# Patient Record
Sex: Female | Born: 1940 | Race: White | Hispanic: No | Marital: Married
Health system: Southern US, Community
[De-identification: ages and names within clinical notes are randomized; demographics above are authoritative.]

---

## 2006-01-07 ENCOUNTER — Other Ambulatory Visit: Payer: Self-pay

## 2006-01-07 ENCOUNTER — Emergency Department: Payer: Self-pay | Admitting: Emergency Medicine

## 2009-07-26 ENCOUNTER — Ambulatory Visit: Payer: Self-pay | Admitting: Rheumatology

## 2009-08-14 ENCOUNTER — Ambulatory Visit: Payer: Self-pay | Admitting: Unknown Physician Specialty

## 2009-08-16 ENCOUNTER — Ambulatory Visit: Payer: Self-pay | Admitting: Unknown Physician Specialty

## 2009-11-06 ENCOUNTER — Ambulatory Visit: Payer: Self-pay | Admitting: Rheumatology

## 2011-01-01 ENCOUNTER — Ambulatory Visit: Payer: Self-pay | Admitting: Family Medicine

## 2011-01-29 ENCOUNTER — Ambulatory Visit: Payer: Self-pay | Admitting: Internal Medicine

## 2011-02-06 ENCOUNTER — Ambulatory Visit: Payer: Self-pay | Admitting: Internal Medicine

## 2011-03-31 ENCOUNTER — Ambulatory Visit: Payer: Self-pay | Admitting: Gastroenterology

## 2011-04-02 LAB — PATHOLOGY REPORT

## 2012-02-09 ENCOUNTER — Ambulatory Visit: Payer: Self-pay | Admitting: Family Medicine

## 2012-02-16 ENCOUNTER — Emergency Department: Payer: Self-pay | Admitting: *Deleted

## 2012-02-16 LAB — BASIC METABOLIC PANEL
Calcium, Total: 8.9 mg/dL (ref 8.5–10.1)
EGFR (African American): >60
EGFR (Non-African Amer.): 58 — ABNORMAL LOW
Glucose: 141 mg/dL — ABNORMAL HIGH (ref 65–99)
Potassium: 4.2 mmol/L (ref 3.5–5.1)
Sodium: 140 mmol/L (ref 136–145)

## 2012-02-16 LAB — URINALYSIS, COMPLETE
Bilirubin,UR: NEGATIVE
Glucose,UR: NEGATIVE mg/dL (ref 0–75)
Ph: 8 (ref 4.5–8.0)
Specific Gravity: 1.013 (ref 1.003–1.030)
Squamous Epithelial: 3

## 2012-02-16 LAB — CK TOTAL AND CKMB (NOT AT ARMC): CK, Total: 101 U/L (ref 21–215)

## 2012-02-16 LAB — CBC
MCH: 32.1 pg (ref 26.0–34.0)
Platelet: 204 10*3/uL (ref 150–440)
RDW: 12.4 % (ref 11.5–14.5)
WBC: 5.4 10*3/uL (ref 3.6–11.0)

## 2012-03-29 ENCOUNTER — Encounter: Payer: Self-pay | Admitting: Internal Medicine

## 2012-05-23 ENCOUNTER — Emergency Department: Payer: Self-pay | Admitting: Unknown Physician Specialty

## 2012-05-23 LAB — COMPREHENSIVE METABOLIC PANEL
Albumin: 3.7 g/dL (ref 3.4–5.0)
Anion Gap: 9 (ref 7–16)
Bilirubin,Total: 0.9 mg/dL (ref 0.2–1.0)
Calcium, Total: 9.4 mg/dL (ref 8.5–10.1)
Chloride: 103 mmol/L (ref 98–107)
Co2: 26 mmol/L (ref 21–32)
EGFR (African American): >60
Glucose: 155 mg/dL — ABNORMAL HIGH (ref 65–99)
Osmolality: 283 (ref 275–301)
Potassium: 4.5 mmol/L (ref 3.5–5.1)
SGOT(AST): 41 U/L — ABNORMAL HIGH (ref 15–37)

## 2012-05-23 LAB — URINALYSIS, COMPLETE
Bilirubin,UR: NEGATIVE
Ph: 6 (ref 4.5–8.0)
Protein: 30
RBC,UR: 107 /HPF (ref 0–5)
Squamous Epithelial: 3
WBC UR: 2 /HPF (ref 0–5)

## 2012-05-23 LAB — CBC
HCT: 42.3 % (ref 35.0–47.0)
HGB: 14.6 g/dL (ref 12.0–16.0)
MCH: 32 pg (ref 26.0–34.0)
MCV: 93 fL (ref 80–100)
RBC: 4.57 10*6/uL (ref 3.80–5.20)
RDW: 12.8 % (ref 11.5–14.5)

## 2012-05-27 ENCOUNTER — Emergency Department: Payer: Self-pay | Admitting: Unknown Physician Specialty

## 2012-05-27 LAB — BASIC METABOLIC PANEL
BUN: 26 mg/dL — ABNORMAL HIGH (ref 7–18)
Calcium, Total: 9.8 mg/dL (ref 8.5–10.1)
Chloride: 106 mmol/L (ref 98–107)
Creatinine: 1.41 mg/dL — ABNORMAL HIGH (ref 0.60–1.30)
EGFR (African American): 44 — ABNORMAL LOW
EGFR (Non-African Amer.): 38 — ABNORMAL LOW
Glucose: 117 mg/dL — ABNORMAL HIGH (ref 65–99)
Potassium: 4.3 mmol/L (ref 3.5–5.1)
Sodium: 138 mmol/L (ref 136–145)

## 2012-05-27 LAB — CBC
HCT: 47.8 % — ABNORMAL HIGH (ref 35.0–47.0)
MCHC: 33.7 g/dL (ref 32.0–36.0)
MCV: 94 fL (ref 80–100)
Platelet: 272 10*3/uL (ref 150–440)
RBC: 5.1 10*6/uL (ref 3.80–5.20)
RDW: 13.3 % (ref 11.5–14.5)
WBC: 10.3 10*3/uL (ref 3.6–11.0)

## 2012-05-28 LAB — URINE CULTURE

## 2012-06-04 ENCOUNTER — Ambulatory Visit: Payer: Self-pay | Admitting: Urology

## 2012-11-18 ENCOUNTER — Ambulatory Visit: Payer: Self-pay | Admitting: Urology

## 2013-02-09 ENCOUNTER — Ambulatory Visit: Payer: Self-pay | Admitting: Family Medicine

## 2013-11-23 ENCOUNTER — Ambulatory Visit: Payer: Self-pay | Admitting: Family Medicine

## 2014-02-20 ENCOUNTER — Ambulatory Visit: Payer: Self-pay | Admitting: Family Medicine

## 2014-06-28 IMAGING — CT CT ABD-PELV W/O CM
1 of 2 series · 15 of 32 positions shown, 19 images · non-contrast
Comparison: none

REASON FOR EXAM: (1) left flank pain; (2) left flank pain
COMMENTS:

[Series 2: 3mm soft tissue · axial · 0.81mm/px · z∈[-1147,-673]mm · 15 of 174 slices shown, 19 images]
[im 8/174  soft-tissue]
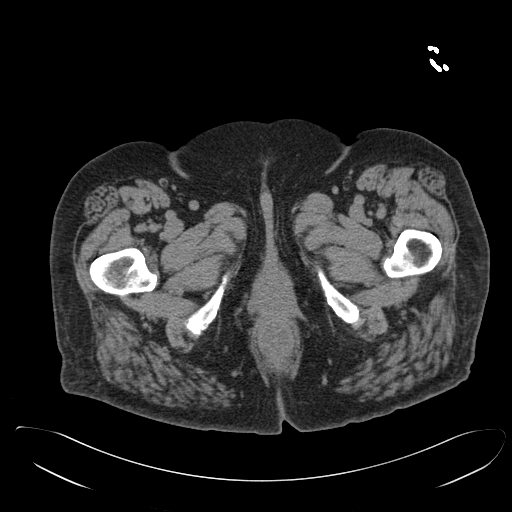
[im 8/174  bone]
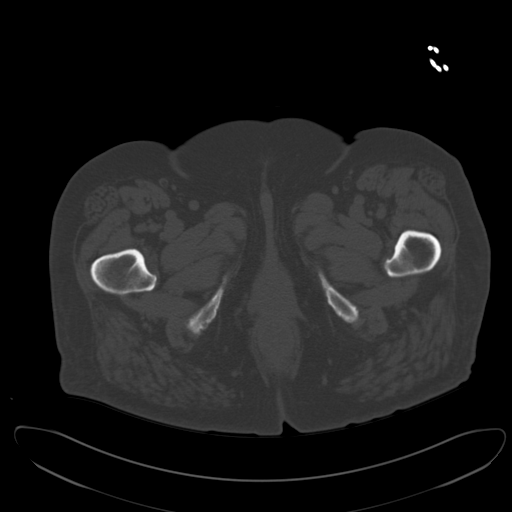
[im 23/174  soft-tissue]
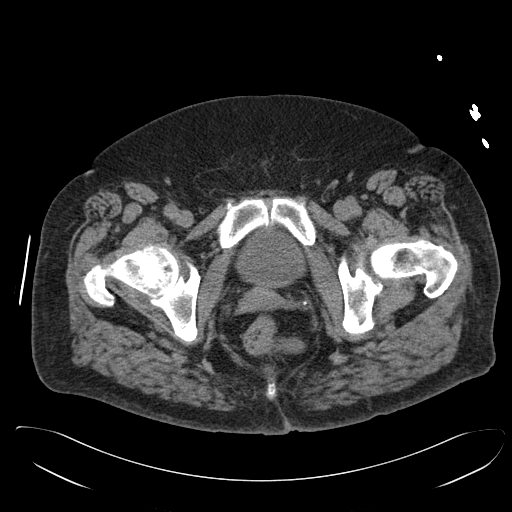
[im 38/174  soft-tissue]
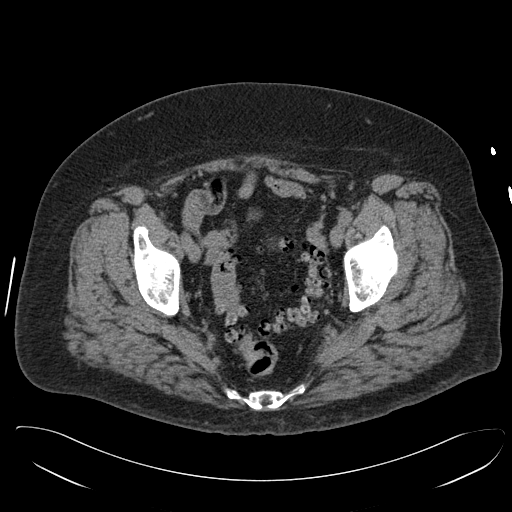
[im 46/174  soft-tissue]
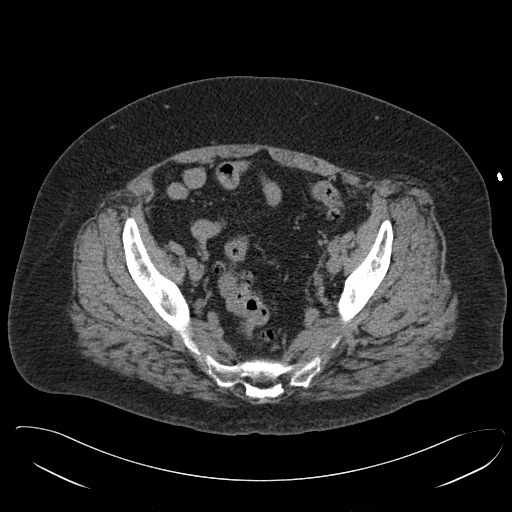
[im 61/174  soft-tissue]
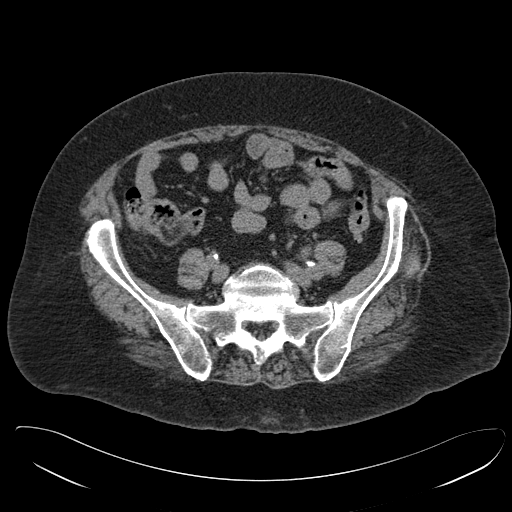
[im 76/174  soft-tissue]
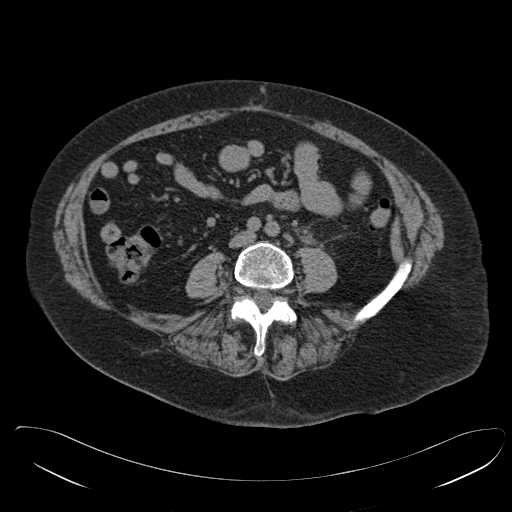
[im 91/174  soft-tissue]
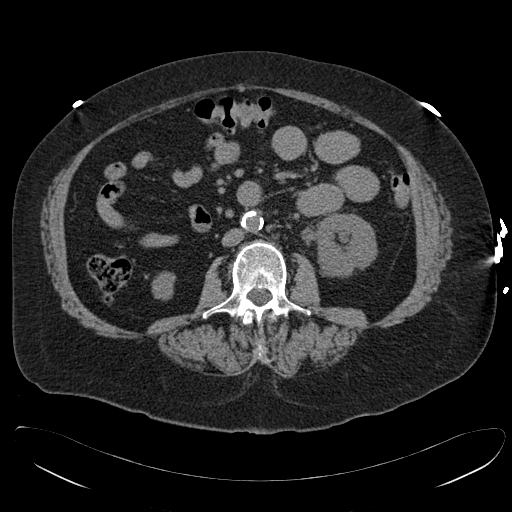
[im 98/174  soft-tissue]
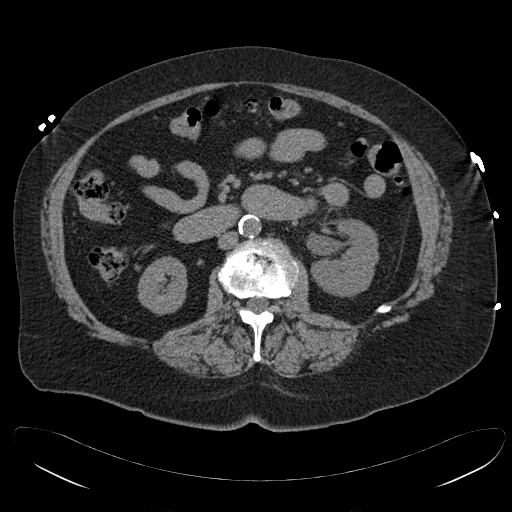
[im 113/174  soft-tissue]
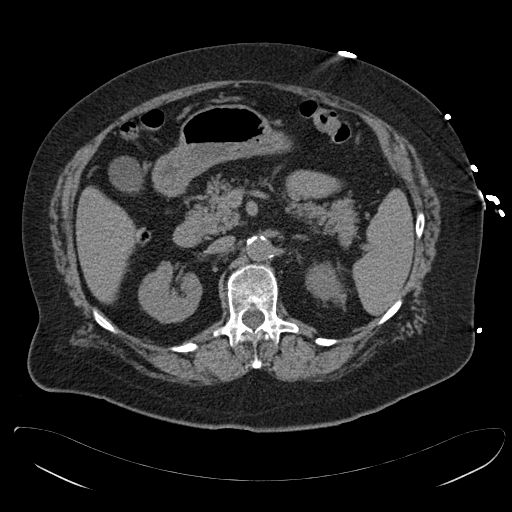
[im 113/174  bone]
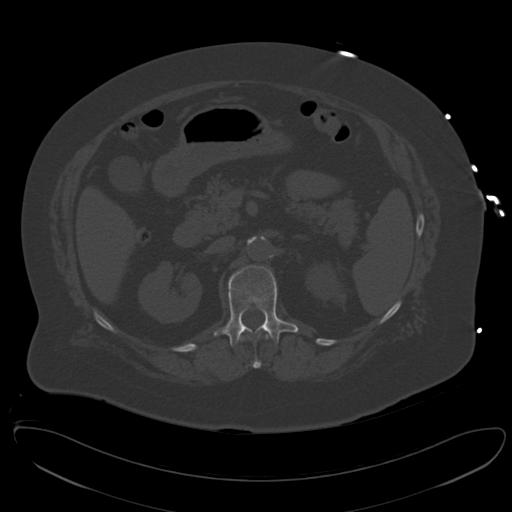
[im 128/174  soft-tissue]
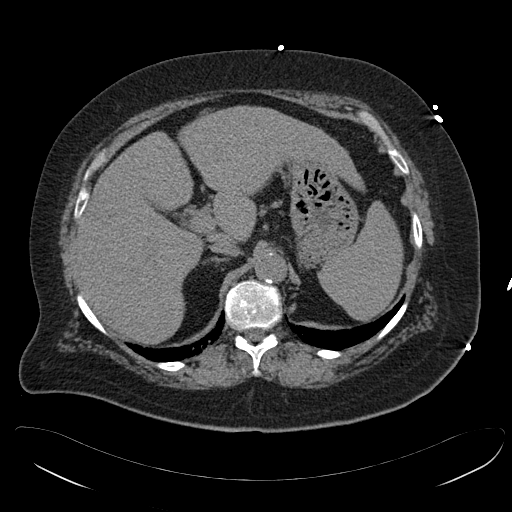
[im 136/174  soft-tissue]
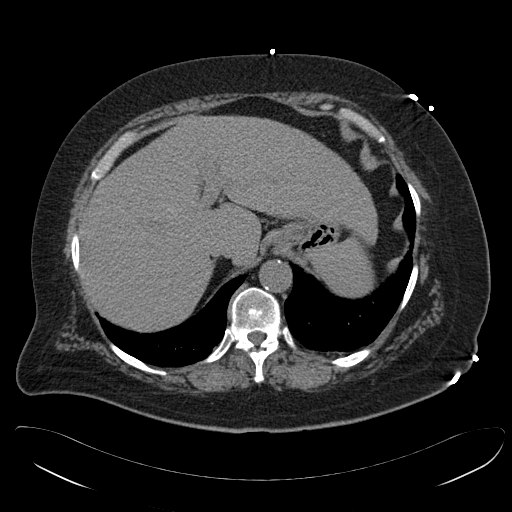
[im 143/174  lung]
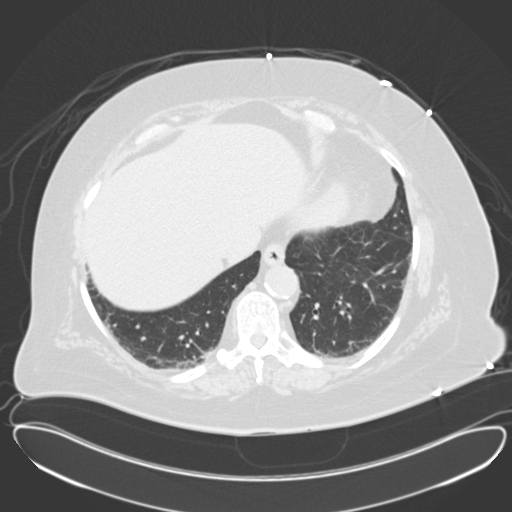
[im 151/174  soft-tissue]
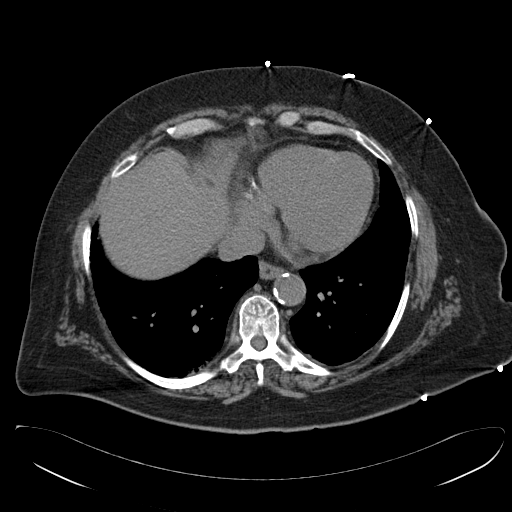
[im 151/174  lung]
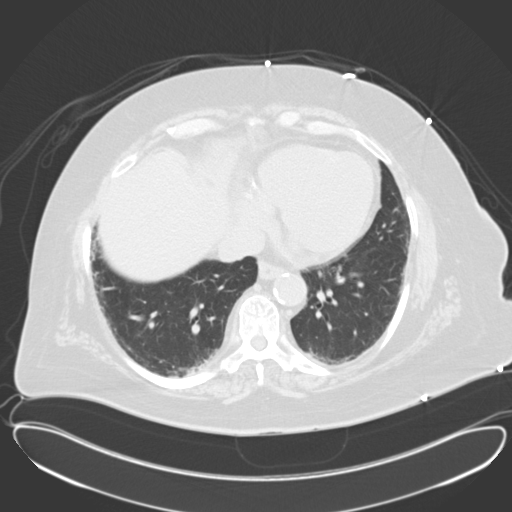
[im 158/174  lung]
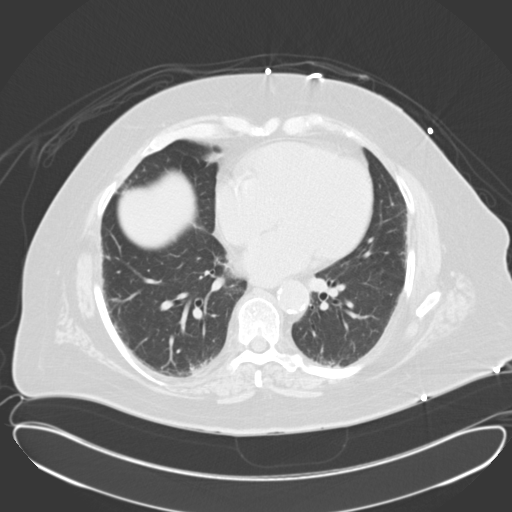
[im 166/174  soft-tissue]
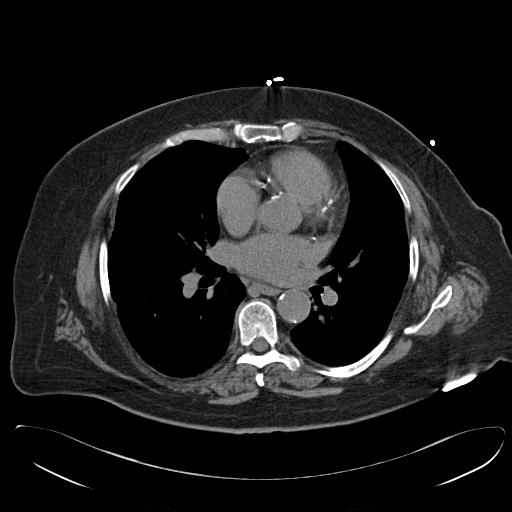
[im 166/174  lung]
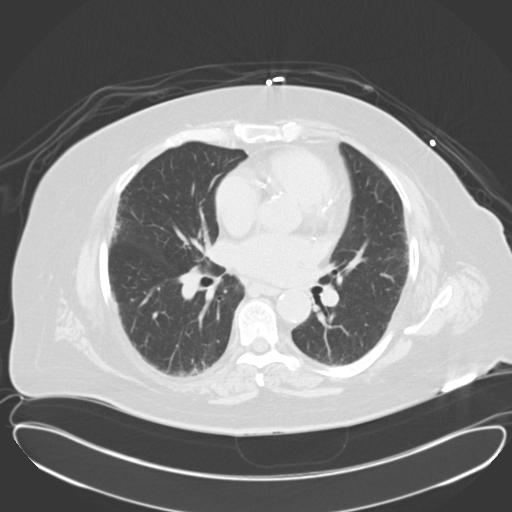

[15 of 32 positions shown; findings below may reference images not displayed]

PROCEDURE:     CT  - CT ABDOMEN AND PELVIS W[DATE]  [DATE]

RESULT:     Axial noncontrast CT scanning was performed through the abdomen
and pelvis with reconstructions at 3 mm intervals and slice thicknesses.
Review of multiplanar reconstructed images was performed separately on the
VIA monitor.

There is mild hydronephrosis and proximal and mid hydroureter on the left
secondary to a densely calcified stone measuring 6 mm in greatest dimension
demonstrated best on image 131 in the distal third of the ureter. No other
stones are evident on the left. No stones are seen on the right nor is there
evidence of obstruction of the right kidney.

There is sigmoid diverticulosis without evidence of acute diverticulitis.
Scattered diverticula are noted elsewhere in the colon. There is no small or
large bowel obstruction. The liver, gallbladder, spleen, partially distended
stomach, pancreas, adrenal glands, and periaortic and pericaval regions are
normal in appearance. There is a shallow left inguinal hernia containing
only fat. The lumbar vertebral bodies are preserved in height. There is disc
space narrowing at L2-L3 T12-L1 with vacuum phenomena. The lung bases
exhibit minimal fibrotic and atelectatic changes posteriorly.
IMPRESSION: 1. There is mild hydronephrosis and hydroureter on the left secondary to a 6
mm diameter stone in the distal third of the left ureter.
2. There is diverticulosis without evidence of acute diverticulitis.
3. There is mild distention of the gallbladder without evidence of stones or
wall thickening. This may be related to a relative fasting state.

[REDACTED]

## 2014-07-02 IMAGING — US US RENAL KIDNEY
1 series · 14 of 25 positions shown · non-contrast
Comparison: none

REASON FOR EXAM: left flank pain 6mm Umusalama Karagire ureter by ct on [REDACTED]
COMMENTS:

[Series 1: us renal kidney · 0.30mm/px · 14 of 29 slices shown]
[im 1/29]
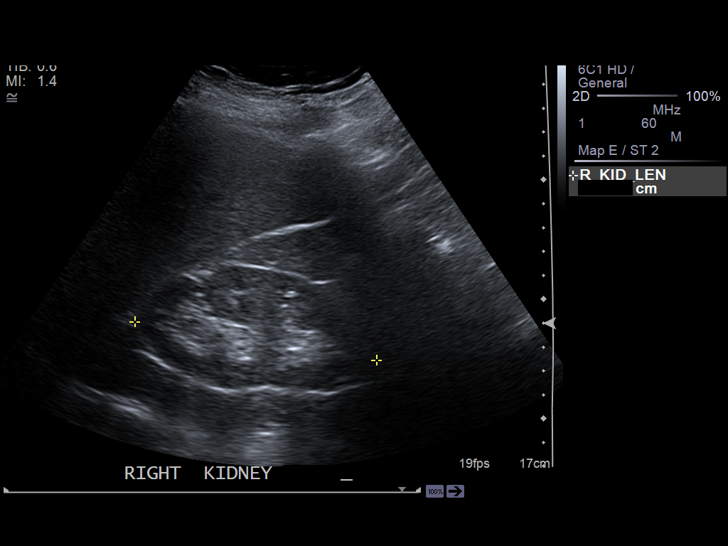
[im 3/29]
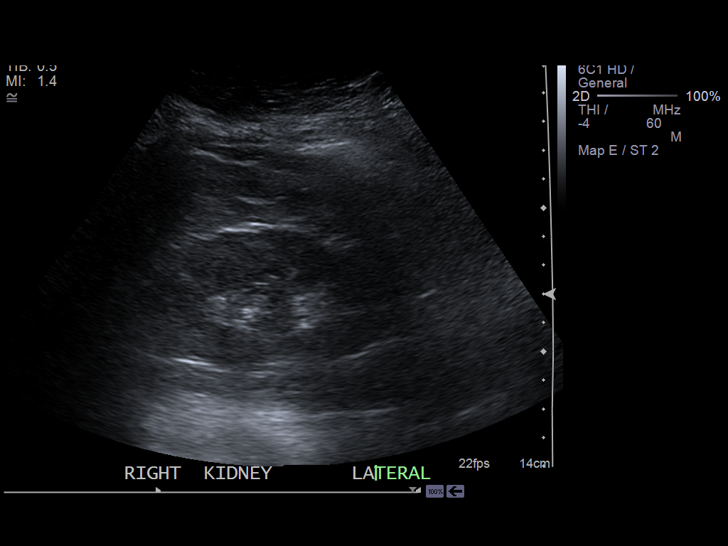
[im 5/29]
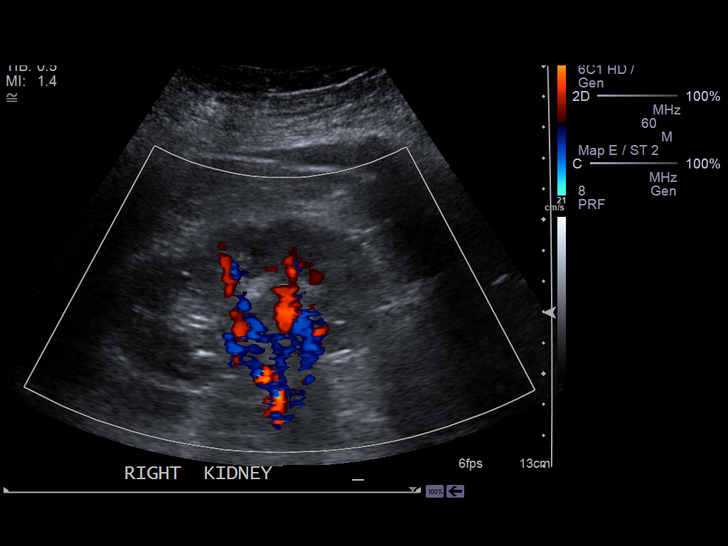
[im 8/29]
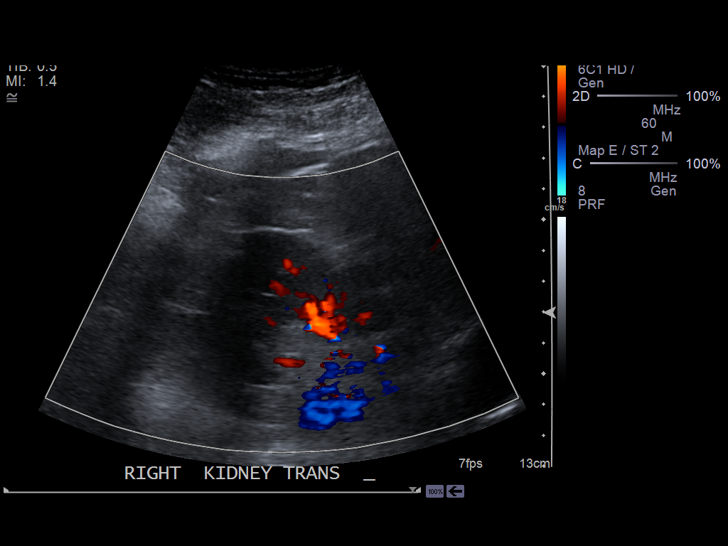
[im 10/29]
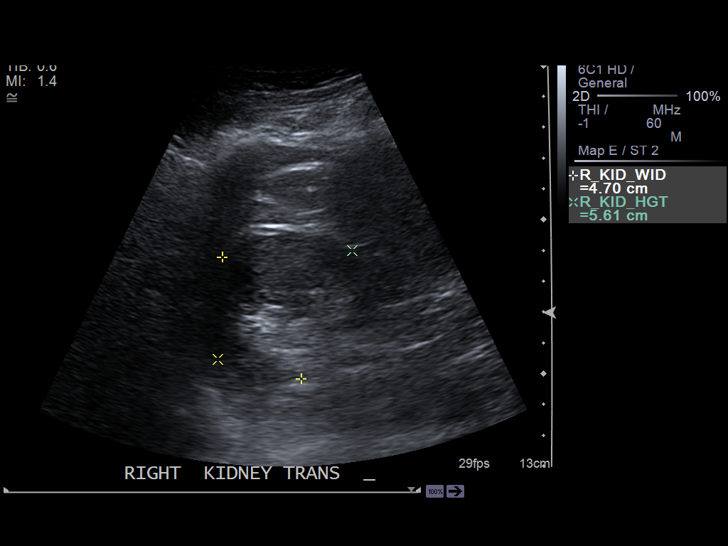
[im 11/29]
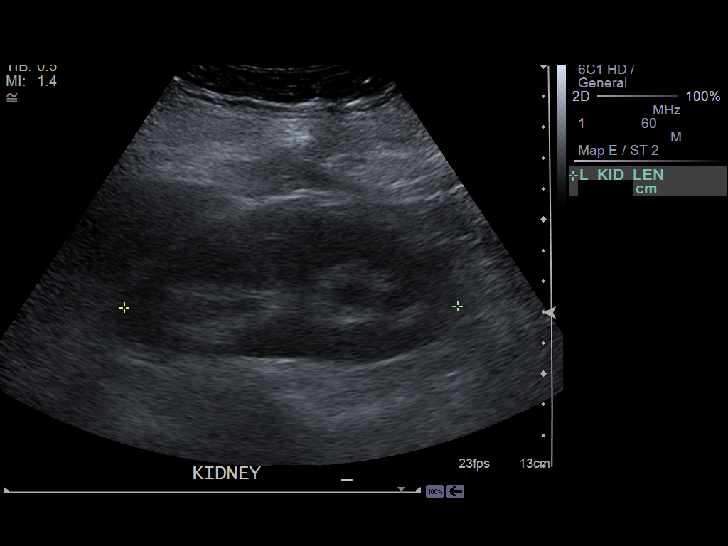
[im 13/29]
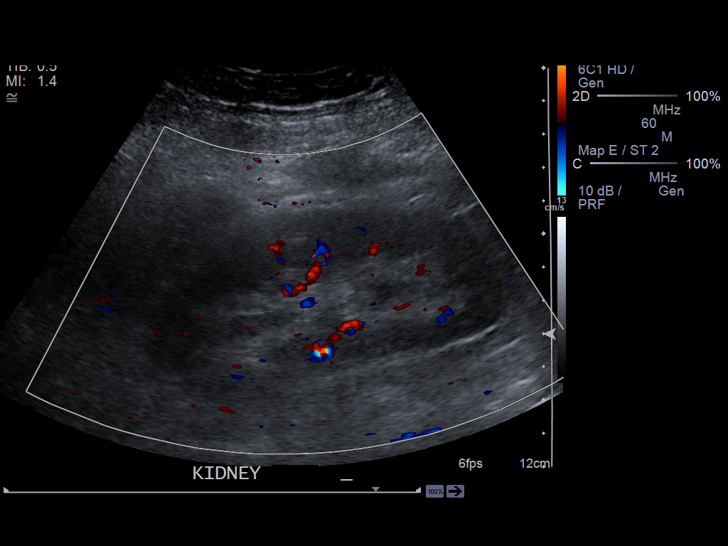
[im 16/29]
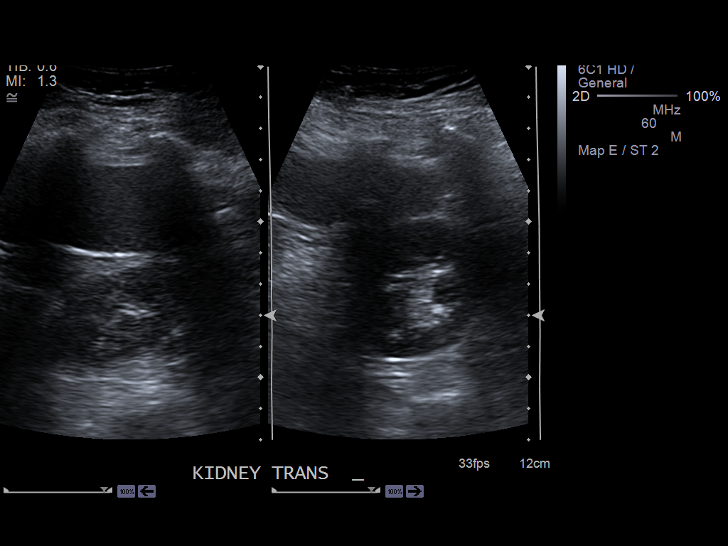
[im 18/29]
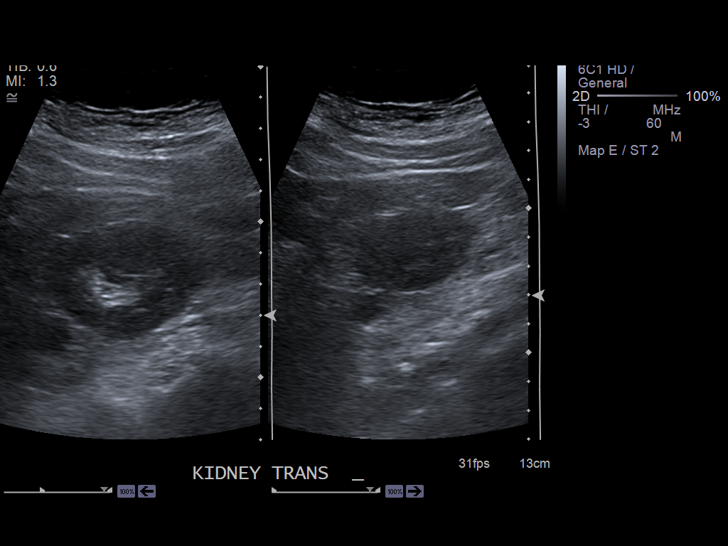
[im 19/29]
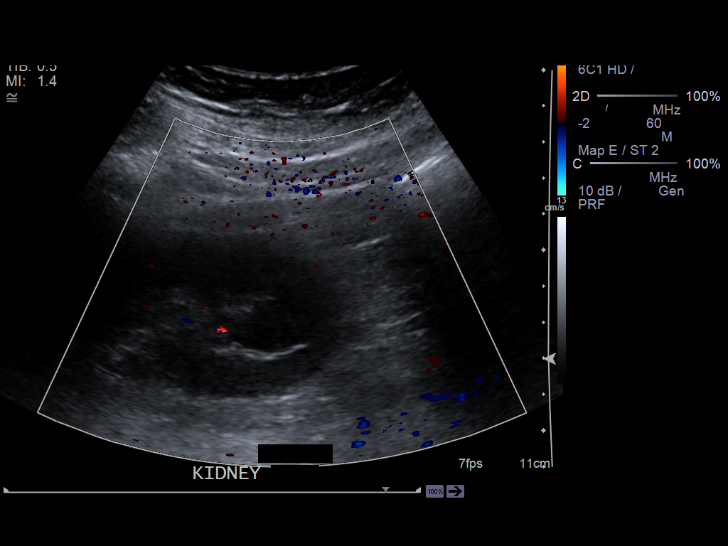
[im 22/29]
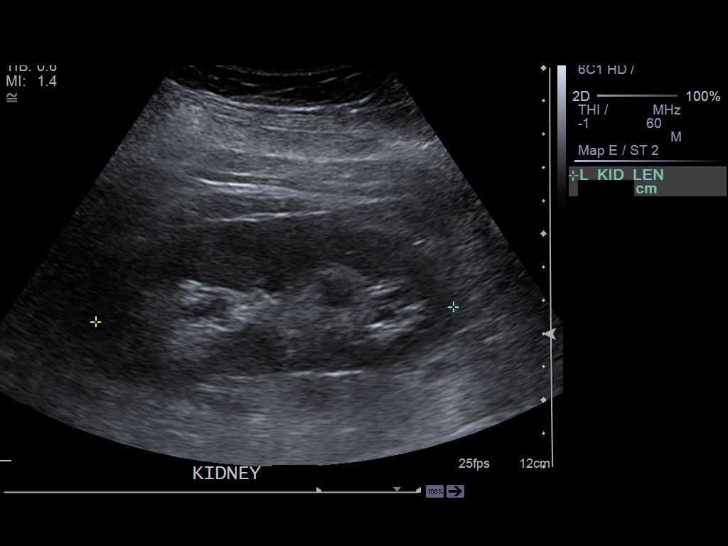
[im 24/29]
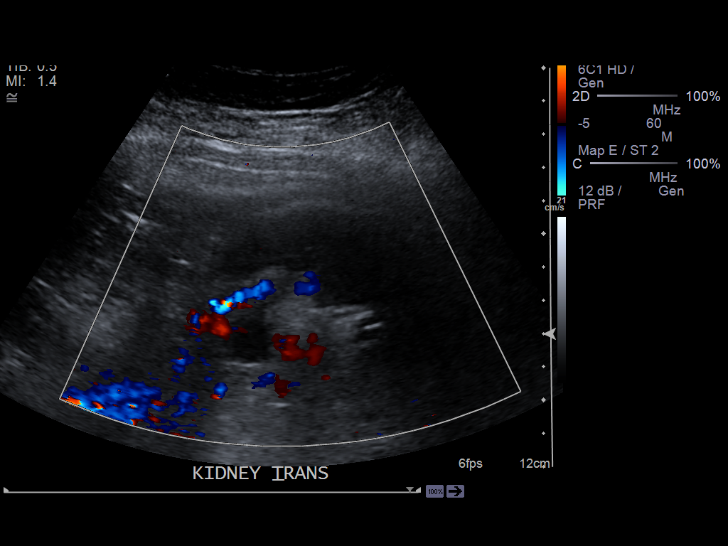
[im 26/29]
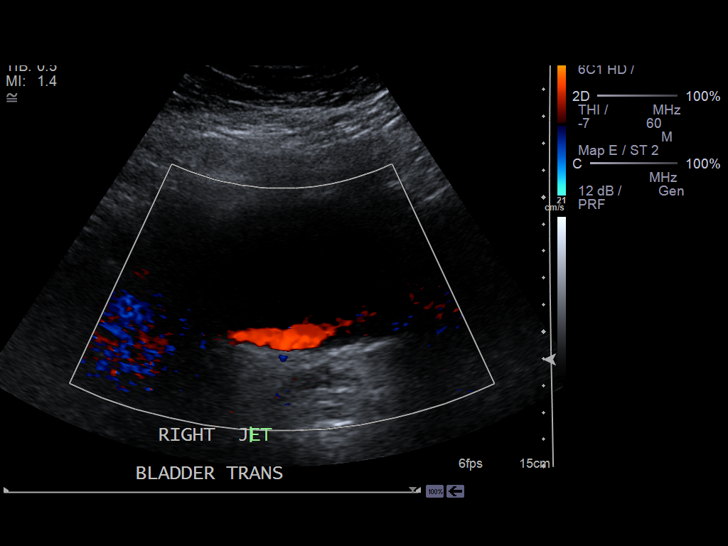
[im 29/29]
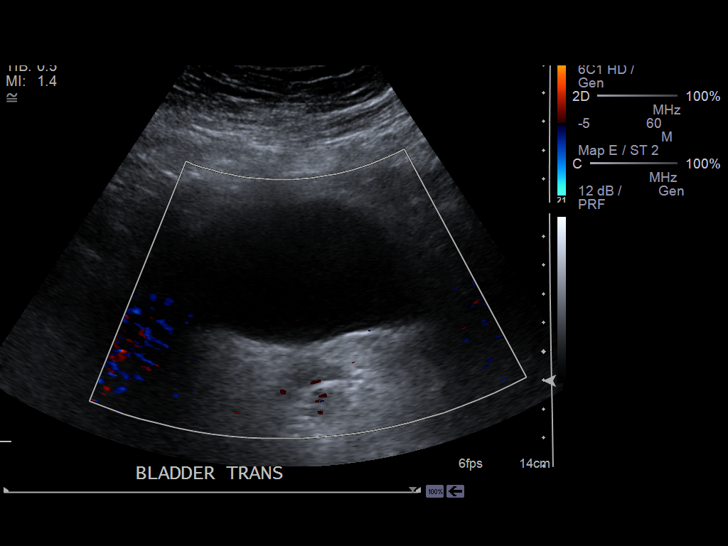

[14 of 25 positions shown; findings below may reference images not displayed]

PROCEDURE:     US  - US KIDNEY  - May 27, 2012  [DATE]

RESULT:     The right kidney measures 10.2 x 4.7 x 5.6 cm. There is no
hydronephrosis and a ureteral jet is demonstrated. On the left no stone is
demonstrated but there is mild hydronephrosis and hydroureter. The left
kidney measures 10.8 by 4.4 x 4.9 cm. Neither kidney exhibits perinephric
fluid collections.
IMPRESSION: There is mild hydronephrosis and hydroureter on the left.
No stone is identified. On the right no hydronephrosis is seen nor are
stones identified.

[REDACTED]

## 2015-01-02 NOTE — Op Note (Signed)
PATIENT NAME:  Diana Cabrera, Diana Cabrera MR#:  [id number] DATE OF BIRTH:  1941-07-09  DATE OF PROCEDURE:  06/04/2012  PREOPERATIVE DIAGNOSES: Left ureterolithiasis, hydronephrosis.   POSTOPERATIVE DIAGNOSES: Left ureterolithiasis, hydronephrosis.   PROCEDURES: Left ureteroscopy with holmium laser lithotripsy, left double-J ureteral stent placement.   SURGEON: Assunta GamblesBrian Halen Antenucci, M.D.   ANESTHESIA: Laryngeal mask airway anesthesia.   INDICATION: The patient is a 74 year old white female with a long history of nephrolithiasis. She presented recently to the emergency room with left-sided flank pain. She was noted to have a 6 mm distal left ureteral calculus with moderate hydronephrosis. She has had continued pain and discomfort with failure of stone progression. She presents for ureteroscopic stone removal.   DESCRIPTION OF PROCEDURE: After informed consent was obtained, the patient was taken to the Operating Room and placed in the dorsal lithotomy position under laryngeal mask airway anesthesia. The patient was then prepped and draped in the usual standard fashion. The 22-French rigid cystoscope was introduced into the urethra under direct vision with no urethral abnormalities noted. Upon entering the bladder, the mucosa was inspected in its entirety. 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 Bilateral ureteral orifices were well visualized with no lesions noted. No other significant abnormalities were appreciated. A guidewire was placed in the left ureteral orifice. It was advanced into the upper pole collecting system without difficulty. The cystoscope was removed with the guidewire left in place. The 6-French rigid ureteroscope was advanced into the urinary bladder. A second guidewire was then utilized to help navigate the ureteroscope into the left ureteral orifice. The distal ureter was noted to be somewhat tight. This required dilation  with ureteroscope for passage. The area was noted to be approximately 1-1/2 to 2 cm in length. The scope was then able to be navigated an additional 3 to 4 cm, to the level of the stone. The holmium laser fiber was then utilized to fragment the stone into multiple smaller pieces. These were then basket extracted. The scope was then advanced to the level of the ureteropelvic junction with no other abnormalities determined. The ureter proximal to the stone was noted to be very dilated and somewhat tortuous. The lower area of the ureter was reexamined upon withdrawal of the ureteroscope. Due to the degree of dilation required with mucosal thinning, the decision was made to proceed with stent placement. The ureteroscope was removed. The 22-French rigid cystoscope was replaced into the urinary bladder, back-loaded over the guidewire. A 6-French x 24 cm double-J ureteral stent was then advanced over the guidewire into the left renal collecting system under fluoroscopic guidance. Adequate curl was noted within the renal pelvis. Upon withdrawal of the guidewire, adequate curl was also noted within the urinary bladder. The pieces of stone were irrigated free from the bladder. They were collected and will be sent for stone analysis. The bladder was then drained. The cystoscope was removed. The patient was returned to the supine position and awakened from laryngeal mask airway anesthesia. She was taken to the recovery room in stable condition. There were no problems or complications. The patient tolerated the procedure well. ____________________________ Madolyn FriezeBrian S. Achilles Dunkope, MD bsc:slb D: 06/04/2012 17:09:31 ET T: 06/05/2012 10:10:12 ET JOB#: [id number]  cc: Madolyn FriezeBrian S. Achilles Dunkope, MD, <Dictator> Madolyn FriezeBRIAN S Zannah Melucci MD ELECTRONICALLY SIGNED 06/05/2012 17:37

## 2015-12-29 IMAGING — US ABDOMEN ULTRASOUND LIMITED
1 series · 14 of 25 positions shown · non-contrast
Comparison: none

[Series 1: abdomen ultrasound limited · 0.31mm/px · 58 acquisitions, 14 frames shown]
[im 1/58]
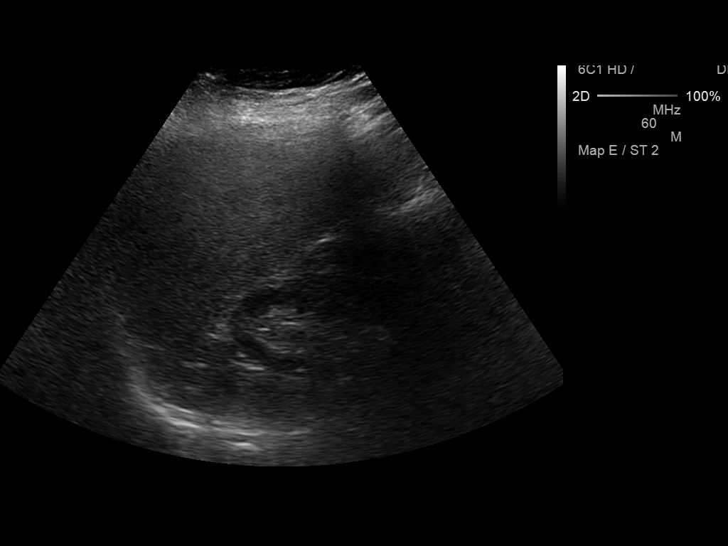
[im 5/58]
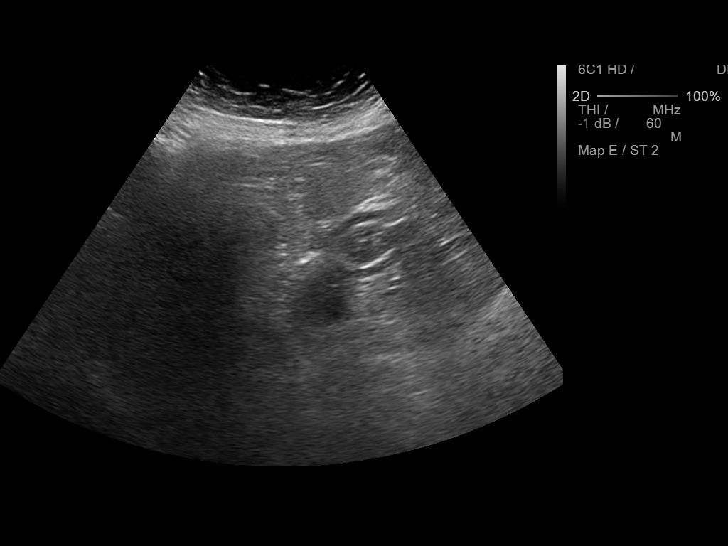
[im 10/58]
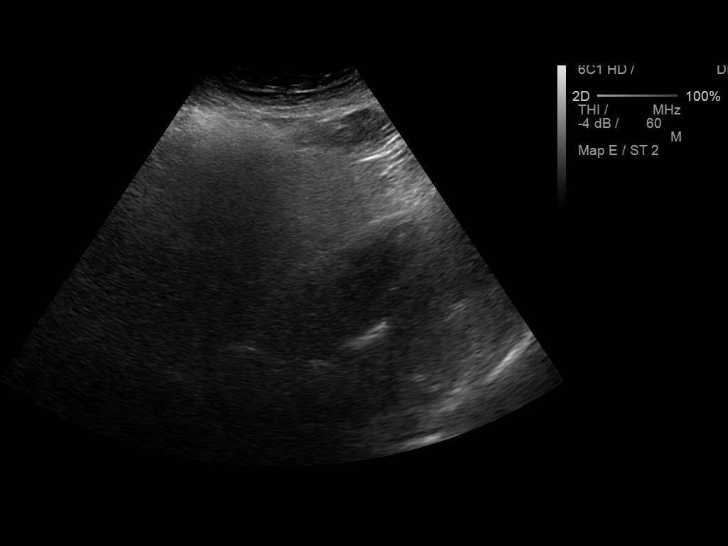
[im 15/58]
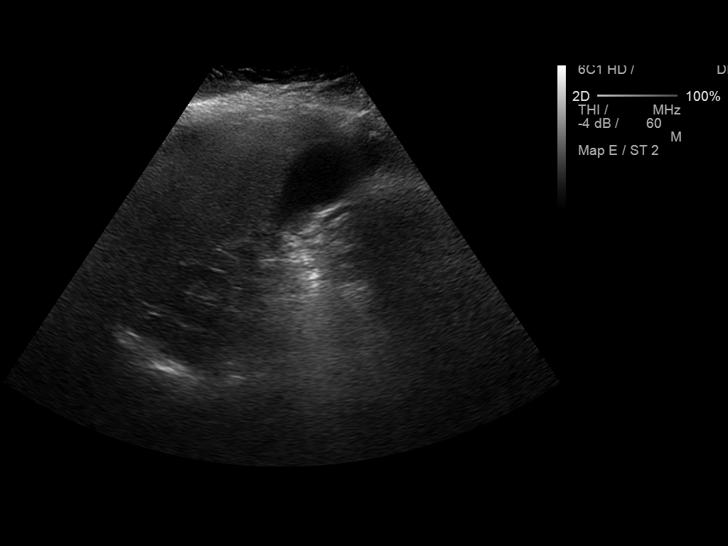
[im 20/58]
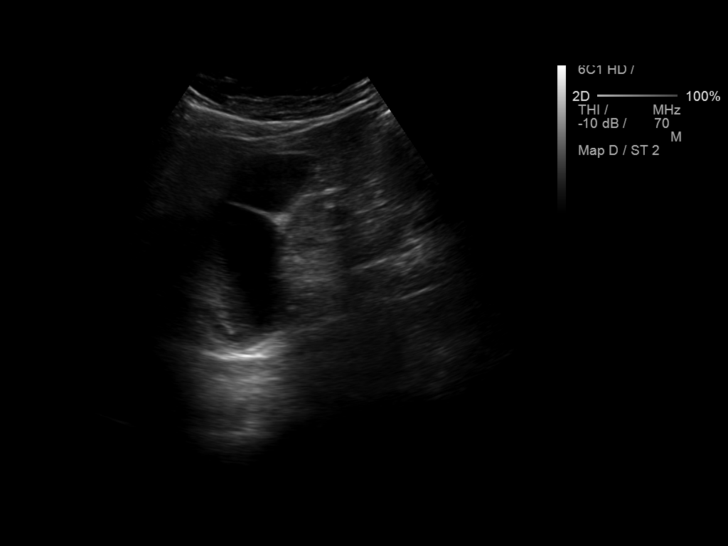
[im 22/58]
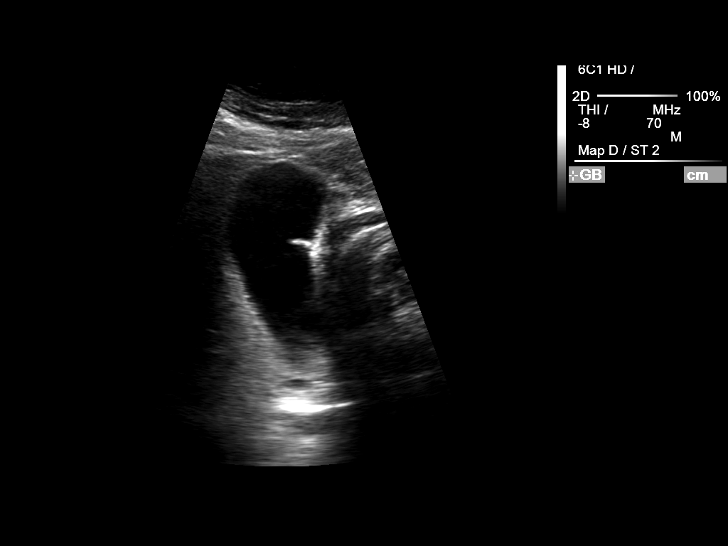
[im 27/58]
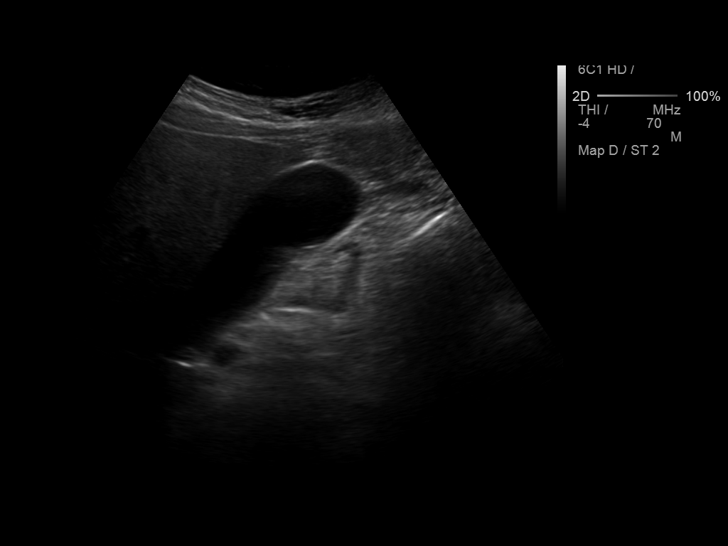
[im 31/58]
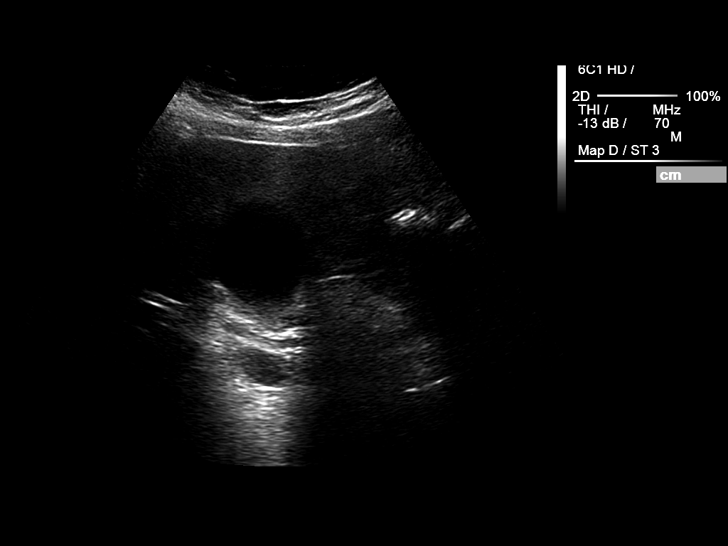
[im 36/58]
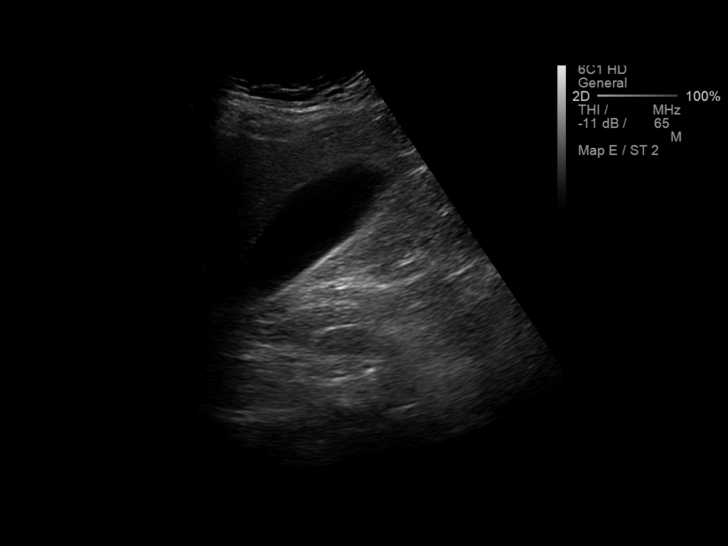
[im 39/58]
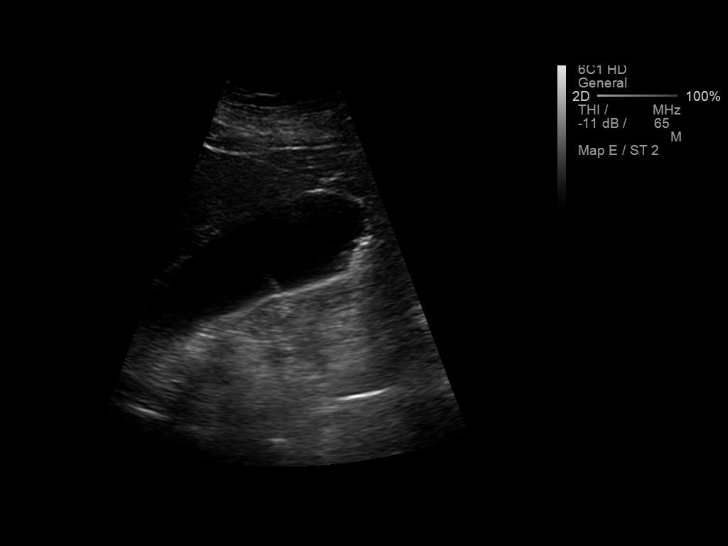
[im 43/58]
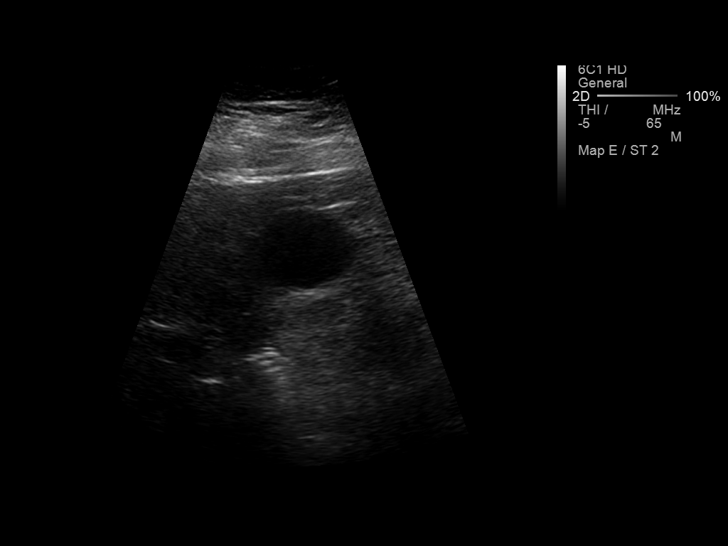
[im 48/58]
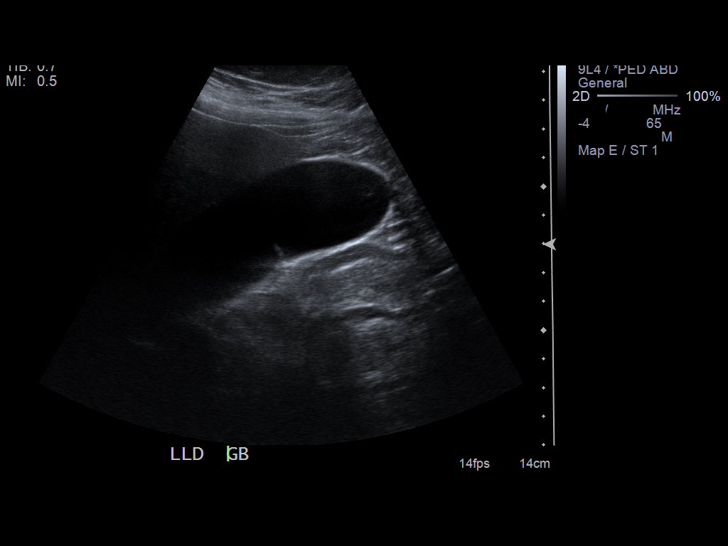
[im 53/58]
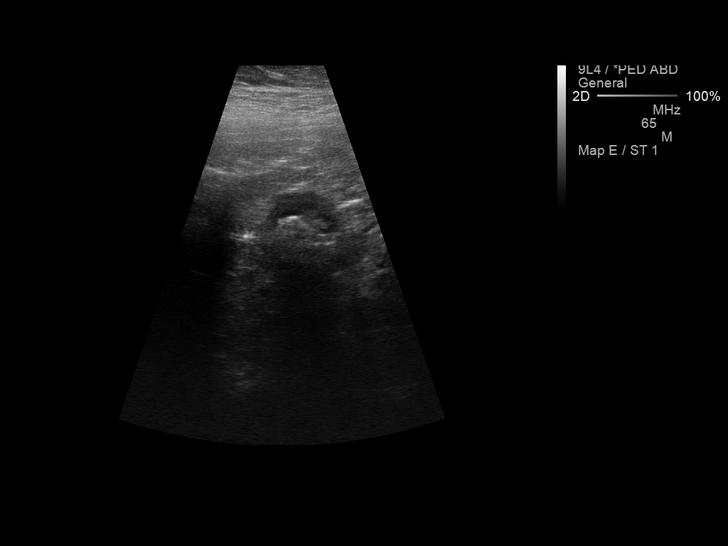
[im 58/58]
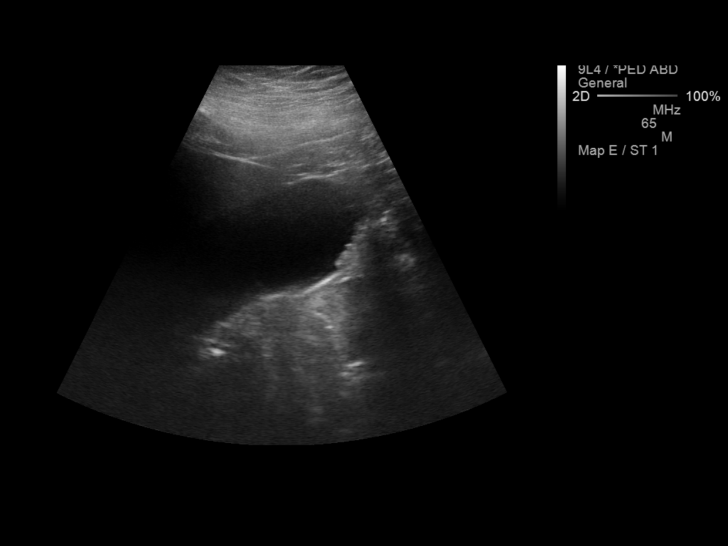

[14 of 25 positions shown; findings below may reference images not displayed]

CLINICAL DATA
Chronic nausea, evaluate liver.

EXAM
US ABDOMEN LIMITED - RIGHT UPPER QUADRANT

COMPARISON
DG ABDOMEN 1V dated 11/18/2012; CT ABD-PELV W/O CM dated 05/23/2012

FINDINGS
Gallbladder:

Small amount of echogenic material layers dependently on upright
imaging. This likely represents a small amount of sludge or small
stones/gravel. No definite stones. No wall thickening. Negative
sonographic [place].

Common bile duct:

Diameter: Normal caliber, 6 mm

Liver:

No focal lesion identified. Within normal limits in parenchymal
echogenicity.

IMPRESSION
Small amount of layering sludge or gravel noted on the upright
views. No evidence of acute cholecystitis.

SIGNATURE

## 2022-02-13 DEATH — deceased
